# Patient Record
Sex: Male | Born: 2005 | Race: White | Hispanic: No | Marital: Single | Smoking: Never smoker
Health system: Southern US, Community
[De-identification: ages and names within clinical notes are randomized; demographics above are authoritative.]

## PROBLEM LIST (undated history)

## (undated) DIAGNOSIS — T7840XA Allergy, unspecified, initial encounter: Secondary | ICD-10-CM

## (undated) DIAGNOSIS — R519 Headache, unspecified: Secondary | ICD-10-CM

## (undated) DIAGNOSIS — R51 Headache: Secondary | ICD-10-CM

## (undated) DIAGNOSIS — H5089 Other specified strabismus: Secondary | ICD-10-CM

## (undated) HISTORY — PX: TYMPANOSTOMY TUBE PLACEMENT: SHX32

## (undated) HISTORY — PX: FRACTURE SURGERY: SHX138

---
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
## 2023-02-04 DIAGNOSIS — Z1331 Encounter for screening for depression: Secondary | ICD-10-CM | POA: Diagnosis not present

## 2023-02-04 DIAGNOSIS — Z13220 Encounter for screening for lipoid disorders: Secondary | ICD-10-CM | POA: Diagnosis not present

## 2023-02-04 DIAGNOSIS — E291 Testicular hypofunction: Secondary | ICD-10-CM | POA: Diagnosis not present

## 2023-02-04 DIAGNOSIS — Z0000 Encounter for general adult medical examination without abnormal findings: Secondary | ICD-10-CM | POA: Diagnosis not present

## 2023-03-05 DIAGNOSIS — Z7951 Long term (current) use of inhaled steroids: Secondary | ICD-10-CM | POA: Diagnosis not present

## 2023-03-05 DIAGNOSIS — R7989 Other specified abnormal findings of blood chemistry: Secondary | ICD-10-CM | POA: Diagnosis not present

## 2023-06-24 DIAGNOSIS — E039 Hypothyroidism, unspecified: Secondary | ICD-10-CM | POA: Diagnosis not present

## 2023-07-21 DIAGNOSIS — R051 Acute cough: Secondary | ICD-10-CM | POA: Diagnosis not present

## 2023-07-21 DIAGNOSIS — J069 Acute upper respiratory infection, unspecified: Secondary | ICD-10-CM | POA: Diagnosis not present

## 2024-02-16 DIAGNOSIS — E039 Hypothyroidism, unspecified: Secondary | ICD-10-CM | POA: Diagnosis not present

## 2024-04-21 DIAGNOSIS — L648 Other androgenic alopecia: Secondary | ICD-10-CM | POA: Diagnosis not present

## 2024-07-03 DIAGNOSIS — J029 Acute pharyngitis, unspecified: Secondary | ICD-10-CM | POA: Diagnosis not present

## 2024-07-03 DIAGNOSIS — B349 Viral infection, unspecified: Secondary | ICD-10-CM | POA: Diagnosis not present

## 2024-07-03 DIAGNOSIS — R051 Acute cough: Secondary | ICD-10-CM | POA: Diagnosis not present

## 2024-07-05 DIAGNOSIS — Z1389 Encounter for screening for other disorder: Secondary | ICD-10-CM | POA: Diagnosis not present

## 2024-07-05 DIAGNOSIS — R5383 Other fatigue: Secondary | ICD-10-CM | POA: Diagnosis not present

## 2024-07-05 DIAGNOSIS — J029 Acute pharyngitis, unspecified: Secondary | ICD-10-CM | POA: Diagnosis not present
